# Patient Record
Sex: Female | Born: 1970 | Race: White | Hispanic: No | Marital: Married | Smoking: Current every day smoker
Health system: Southern US, Community
[De-identification: ages and names within clinical notes are randomized; demographics above are authoritative.]

## PROBLEM LIST (undated history)

## (undated) DIAGNOSIS — F32A Depression, unspecified: Secondary | ICD-10-CM

## (undated) DIAGNOSIS — F429 Obsessive-compulsive disorder, unspecified: Secondary | ICD-10-CM

## (undated) DIAGNOSIS — F329 Major depressive disorder, single episode, unspecified: Secondary | ICD-10-CM

## (undated) HISTORY — PX: BACK SURGERY: SHX140

## (undated) HISTORY — DX: Major depressive disorder, single episode, unspecified: F32.9

## (undated) HISTORY — DX: Obsessive-compulsive disorder, unspecified: F42.9

## (undated) HISTORY — DX: Depression, unspecified: F32.A

---

## 2000-08-20 ENCOUNTER — Other Ambulatory Visit: Admission: RE | Admit: 2000-08-20 | Discharge: 2000-08-20 | Payer: Self-pay | Admitting: Obstetrics & Gynecology

## 2001-06-27 ENCOUNTER — Encounter: Admission: RE | Admit: 2001-06-27 | Discharge: 2001-06-27 | Payer: Self-pay | Admitting: General Practice

## 2001-06-27 ENCOUNTER — Encounter: Payer: Self-pay | Admitting: General Practice

## 2001-07-12 ENCOUNTER — Other Ambulatory Visit: Admission: RE | Admit: 2001-07-12 | Discharge: 2001-07-12 | Payer: Self-pay | Admitting: Obstetrics & Gynecology

## 2003-06-06 ENCOUNTER — Other Ambulatory Visit: Admission: RE | Admit: 2003-06-06 | Discharge: 2003-06-06 | Payer: Self-pay | Admitting: Obstetrics and Gynecology

## 2004-07-15 ENCOUNTER — Other Ambulatory Visit: Admission: RE | Admit: 2004-07-15 | Discharge: 2004-07-15 | Payer: Self-pay | Admitting: Obstetrics & Gynecology

## 2005-08-12 ENCOUNTER — Other Ambulatory Visit: Admission: RE | Admit: 2005-08-12 | Discharge: 2005-08-12 | Payer: Self-pay | Admitting: Obstetrics and Gynecology

## 2005-12-15 ENCOUNTER — Ambulatory Visit: Payer: Self-pay | Admitting: Psychiatry

## 2005-12-16 ENCOUNTER — Other Ambulatory Visit (HOSPITAL_COMMUNITY): Admission: RE | Admit: 2005-12-16 | Discharge: 2006-03-16 | Payer: Self-pay | Admitting: Psychiatry

## 2006-01-06 ENCOUNTER — Ambulatory Visit: Payer: Self-pay | Admitting: Psychiatry

## 2006-03-20 ENCOUNTER — Emergency Department (HOSPITAL_COMMUNITY): Admission: EM | Admit: 2006-03-20 | Discharge: 2006-03-20 | Payer: Self-pay | Admitting: Emergency Medicine

## 2007-07-20 ENCOUNTER — Ambulatory Visit (HOSPITAL_COMMUNITY): Admission: RE | Admit: 2007-07-20 | Discharge: 2007-07-20 | Payer: Self-pay | Admitting: Obstetrics & Gynecology

## 2007-07-20 ENCOUNTER — Encounter (INDEPENDENT_AMBULATORY_CARE_PROVIDER_SITE_OTHER): Payer: Self-pay | Admitting: Obstetrics & Gynecology

## 2008-09-05 ENCOUNTER — Encounter: Admission: RE | Admit: 2008-09-05 | Discharge: 2008-09-05 | Payer: Self-pay | Admitting: Obstetrics & Gynecology

## 2009-09-25 ENCOUNTER — Encounter: Admission: RE | Admit: 2009-09-25 | Discharge: 2009-09-25 | Payer: Self-pay | Admitting: Obstetrics & Gynecology

## 2010-05-26 ENCOUNTER — Encounter: Payer: Self-pay | Admitting: Obstetrics & Gynecology

## 2010-09-16 NOTE — Op Note (Signed)
NAMENORISSA, Pamela Singleton               ACCOUNT NO.:  [id number]   MEDICAL RECORD NO.:  [id number]          PATIENT TYPE:  AMB   LOCATION:  SDC                           FACILITY:  [facility]   PHYSICIAN:  Freddy Finner, M.D.   DATE OF BIRTH:  02/19/1971   DATE OF PROCEDURE:  07/20/2007  DATE OF DISCHARGE:                               OPERATIVE REPORT   PREOPERATIVE DIAGNOSIS:  Menorrhagia, request for voluntary  sterilization.   POSTOPERATIVE DIAGNOSIS:  Menorrhagia, request for voluntary  sterilization.   PROCEDURE:  Laparoscopic tubal sterilization with Filshie clip  application to isthmic portion of fallopian tubes, hysteroscopy, D&C,  NovaSure endometrial ablation.   COMPLICATIONS:  Uterine perforation and reinspection of the endometrial  cavity after endometrial ablation.  Other complications; none.   ESTIMATED BLOOD LOSS:  50 mL.   ANESTHESIA:  General endotracheal.   INDICATIONS FOR PROCEDURE:  The patient is a 40 year old with a history  of menorrhagia and a normal sonohysterogram in the office.  Options of  therapy were discussed with her including endometrial ablation which she  found appropriate for her particular circumstances.  She also definitely  wished not to conceive and for that reason requested permanent  sterilization.   DESCRIPTION OF PROCEDURE:  She was admitted on the morning of surgery.  She was given a bolus of Ancef IV preoperatively.  She was brought to  the operating room and placed under adequate general endotracheal  anesthesia, placed in the dorsal lithotomy position using the [place]  stirrups system.  Betadine prep of abdomen, perineum, and vagina was  carried out in the usual fashion.  Bladder was evacuated with sterile  technique using a Foley catheter.  Sterile drapes were applied after  application of the Hulka tenaculum to the cervix.  A small incision was  made at the umbilicus and through it a nonbladed disposable 11 mm trocar  was introduced  while elevating the anterior abdominal wall manually.  Direct inspection revealed adequate placement with no evidence of injury  on entry.  The pneumoperitoneum was allowed to accumulate with carbon  dioxide gas.  Scanning inspection of the upper abdomen revealed no  apparent abnormalities.  The appendix was not visualized.  The pelvic  contents appeared to be normal except for question of septated uterus by  the gross appearance externally.  This resolved when the uterus was  taken off of traction which was applied with a Hulka tenaculum.  The  tubes and ovaries were normal.  There was no peritoneal disease.  A  Filshie clip was placed using the application device which was  backloaded through the operating channel of the laparoscope.  This was  placed on the isthmic portion of each tube and photographs were made  before and after placement of clips.  Gas was then allowed to escape  from the abdomen.  Instruments were removed.  The incision was  anesthetized with 0.5% plain Marcaine.  Incision was closed with  interrupted subcuticular sutures of 3-0 Dexon.  Attention was turned  vaginally.  The uterus sounded to 9 cm.  The cervix sounded to  3.5 cm.  This made the cavity length 5.5 cm.  The cervix was dilated with a Hegar  dilator.  The 12.5 degree hysteroscope was introduced using Ringer's  lactate as a distending medium.  The cavity appeared to be normal.  Gentle thorough curettage was performed and exploration with Randall  stone forceps to collect an endometrial sample.  The NovaSure device was  then loaded without difficulty and the ablation accomplished after  appropriate test and adequate seal.  The cavity width was 3.2 cm.  Power  consumed during the procedure was 97 watts.  The duration of the  ablation was 75 seconds.  The hysteroscope was then reintroduced for  inspection of the cavity, but actually passed through the wall of the  uterus.  This was confirmed by inspection of  intra-abdominal contents,  specifically bowel and fat.  No injury was visualized, but as a  precaution the Foley catheter was placed and the patient was given IV  Indigo Carmine.  It was elected to reinspect laparoscopically and a  defect was identified and photographed in the fundal region  approximately 4-5 cm above the bladder reflection on the anterior fundal  surface.  The defect was approximately 5-6 mm in cross section.  Bleeding was minimal from this site, but bipolar coagulation resolved  the bleeding.  Please note that after perforating the uterus with  careful visual inspection and reinsertion of the cannula, the cavity was  identified and photographed and complete ablation had occurred.  The  laparoscopic procedure did require a second incision placed just above  the symphysis through which a 5 mm trocar was placed.  This allowed  placement of [place] irrigation system and exploration of the defect in  the uterine fundus.  Irrigating solution was Ringer's lactate.  The  irrigating solution and small amount of blood were aspirated from the  abdomen.  Hemostasis was noted to be complete under irrigation and  reduced intra-abdominal pressure.  Blue urine was identified only in the  Foley bag and absence of injury to the bladder was therefore confirmed.  This produced a stable clinical environment with no significant  additional risks.  After removing the irrigating solution from the  abdomen, the umbilical incision was reclosed with 3-0 subcuticular Dexon  sutures and the lower incision was also closed with a subcuticular 3-0  Dexon and 1/4 inch Steri-Strips.  The patient was awakened and taken to  the recovery room in good condition. She had an uneventful PACU stay and  was discharged home with routine outpatient surgical instructions.   This dictation is being dictated on the evening after the procedure and  I have personally contacted the patient at approximately 5:15 p.m.  today  at which time she states that she is doing extremely well without any  significant intra-abdominal or pelvic pain or symptoms.  She sounded  alert and oriented.  The complication was discussed with her as well as  additional symptoms for her to be aware of and to call for immediately  including increased abdominal distention or severe abdominal pain.      Freddy Finner, M.D.  Electronically Signed     WRN/MEDQ  D:  07/20/2007  T:  07/21/2007  Job:  [id number]

## 2010-12-05 IMAGING — MG MM DIGITAL SCREENING
4 series · 4 of 4 positions shown · non-contrast
Comparison: none

DG SCREEN MAMMOGRAM BILATERAL
Bilateral CC and MLO view(s) were taken.

DIGITAL SCREENING MAMMOGRAM WITH CAD:
The breast tissue is heterogeneously dense.  No masses or malignant type calcifications are 
identified.  Compared with prior studies.
Images were processed with CAD.

[R CC]
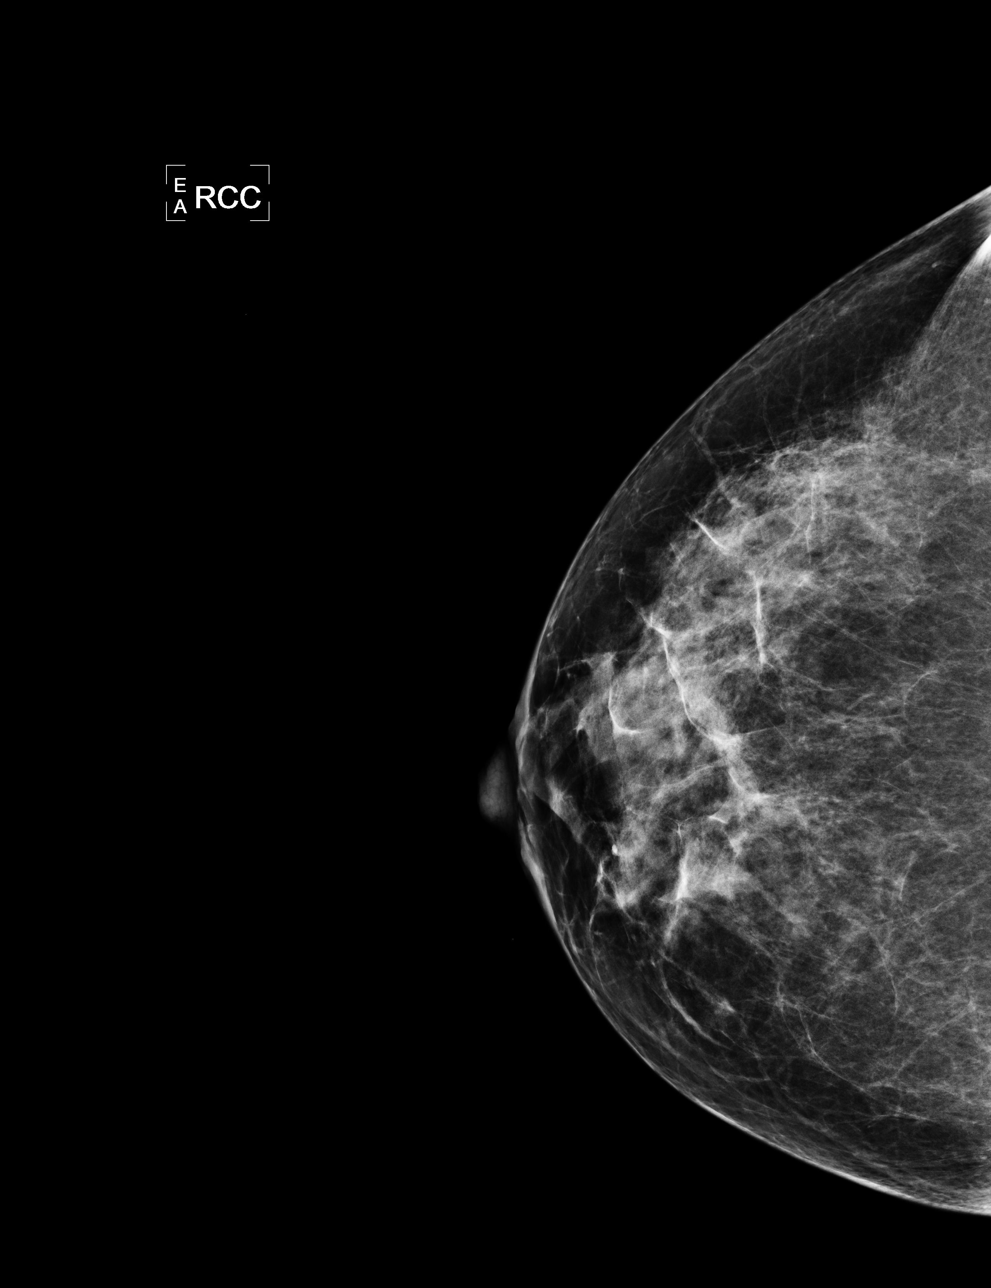

[L CC]
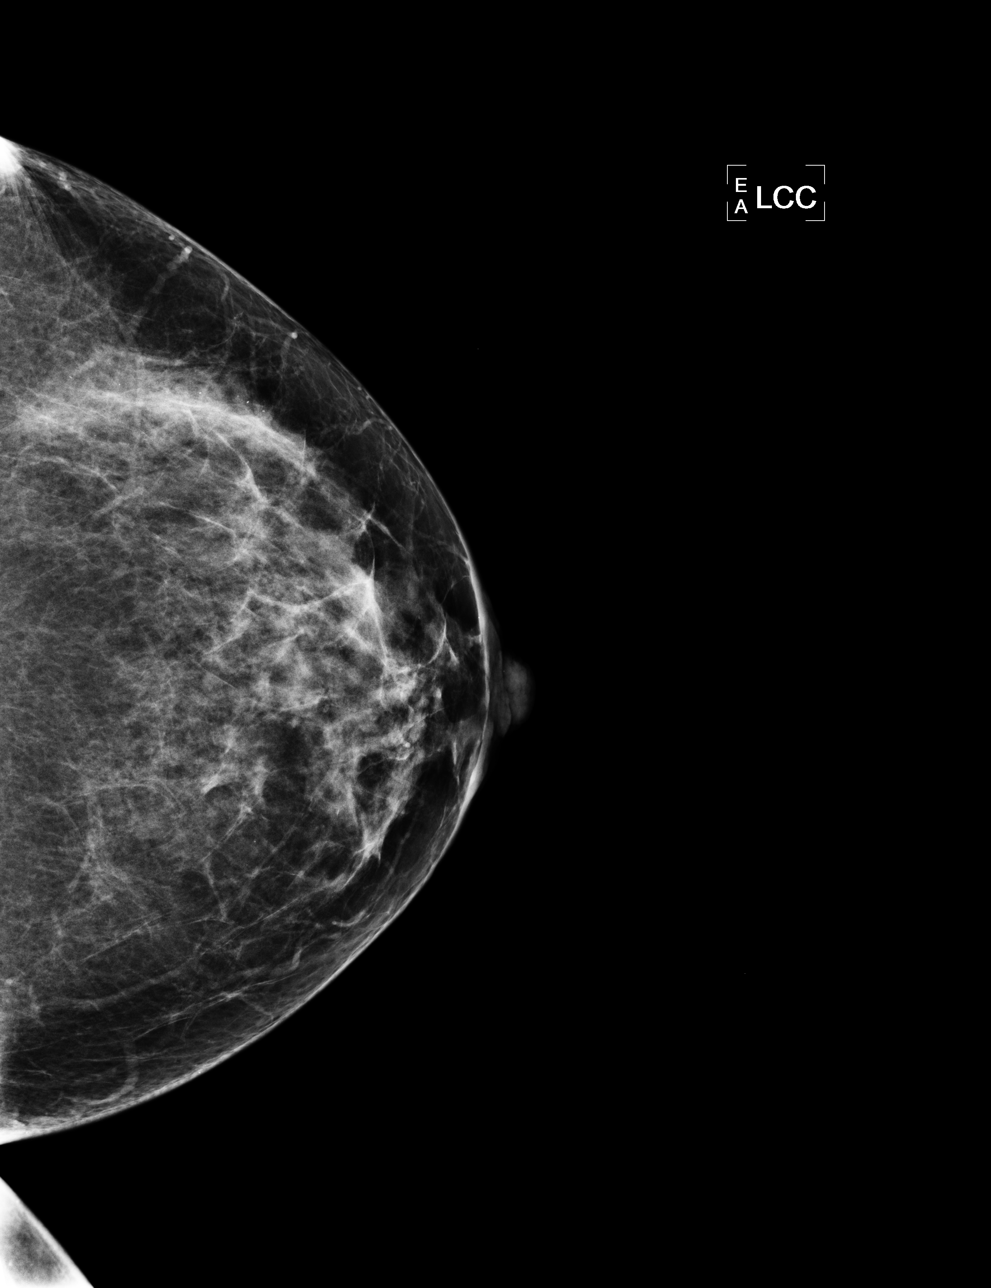

[L MLO]
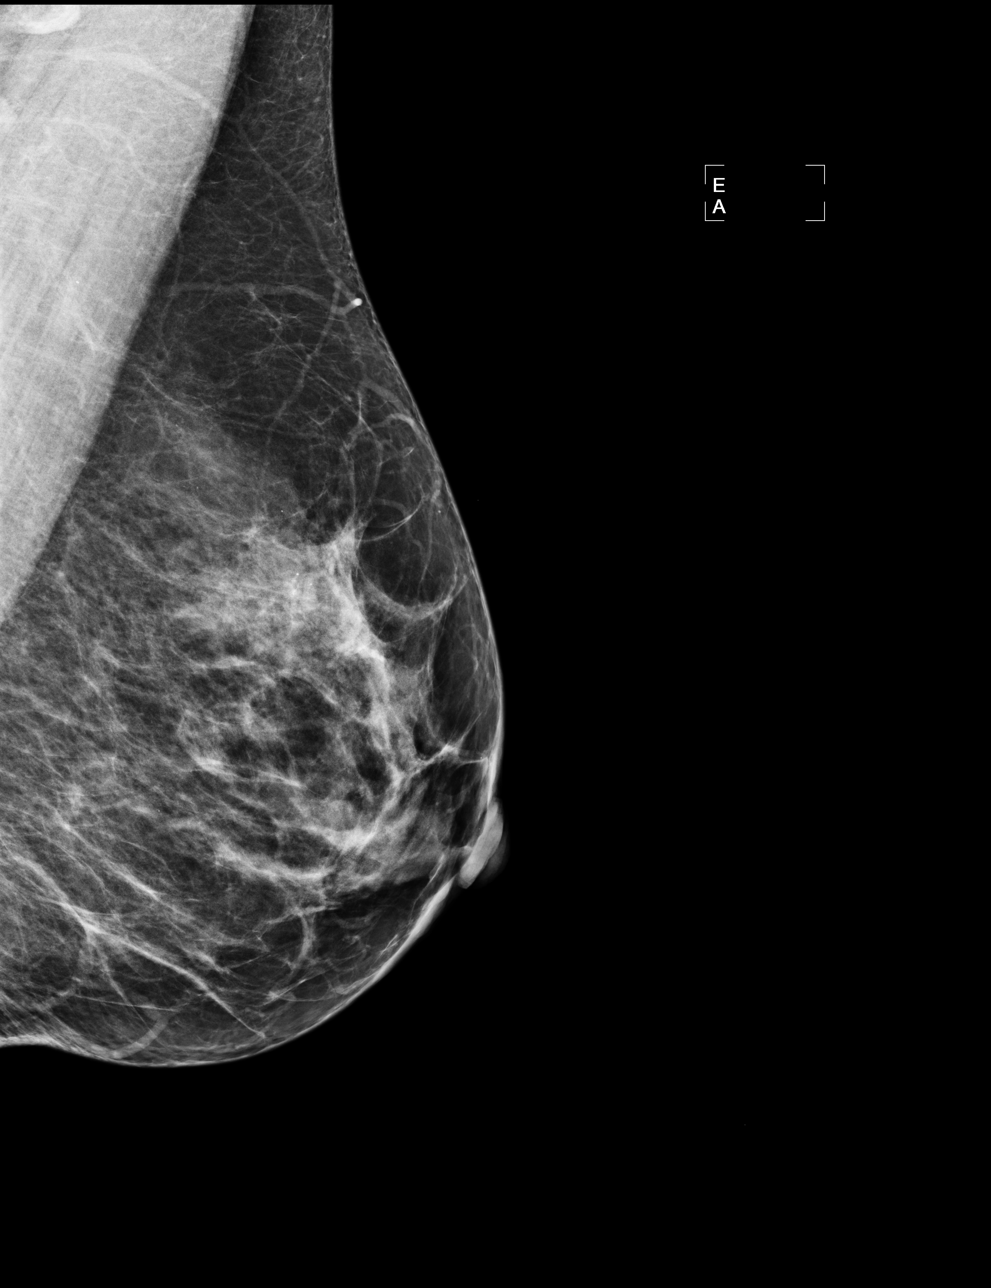

[R MLO]
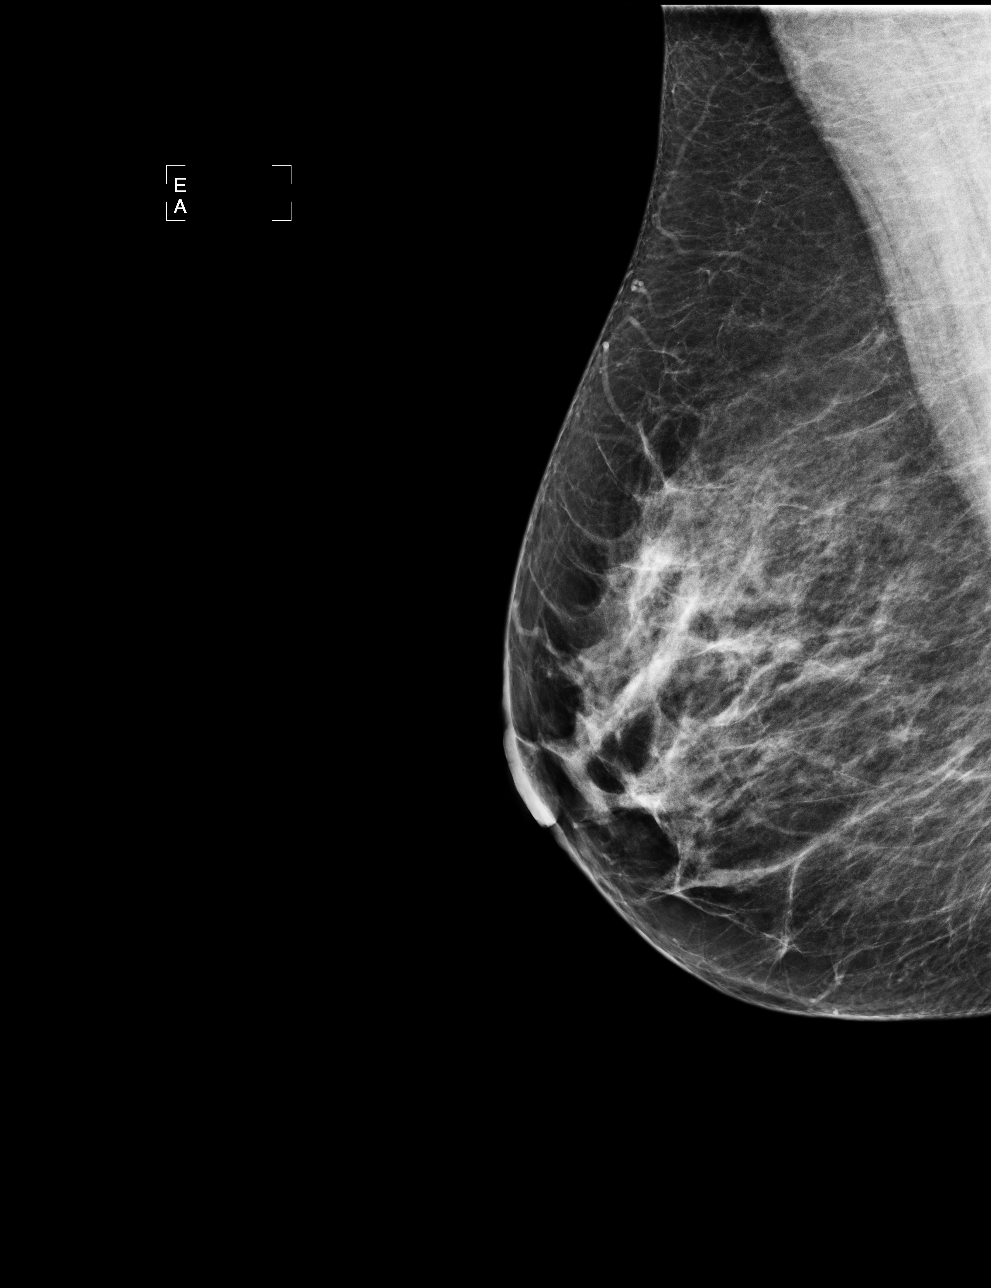

[4 of 4 positions shown; findings below may reference images not displayed]

IMPRESSION: No specific mammographic evidence of malignancy.  Next screening mammogram is recommended in one 
year.

A result letter of this screening mammogram will be mailed directly to the patient.

ASSESSMENT: Negative - BI-RADS 1

Screening mammogram in 1 year.
,

## 2011-01-26 LAB — CBC
Hemoglobin: 13.9
RBC: 4.18
RDW: 11.9
WBC: 8.8

## 2015-05-02 ENCOUNTER — Ambulatory Visit (INDEPENDENT_AMBULATORY_CARE_PROVIDER_SITE_OTHER): Payer: 59 | Admitting: Psychiatry

## 2015-05-02 ENCOUNTER — Encounter (HOSPITAL_COMMUNITY): Payer: Self-pay | Admitting: Psychiatry

## 2015-05-02 VITALS — BP 118/78 | HR 74 | Ht 60.0 in | Wt 140.0 lb

## 2015-05-02 DIAGNOSIS — F411 Generalized anxiety disorder: Secondary | ICD-10-CM | POA: Diagnosis not present

## 2015-05-02 DIAGNOSIS — F429 Obsessive-compulsive disorder, unspecified: Secondary | ICD-10-CM

## 2015-05-02 MED ORDER — ALPRAZOLAM 0.5 MG PO TABS: 0.5000 mg | ORAL_TABLET | Freq: Every evening | ORAL | Status: DC | PRN

## 2015-05-02 MED ORDER — PROPRANOLOL HCL 60 MG PO TABS: 60.0000 mg | ORAL_TABLET | Freq: Every day | ORAL | Status: DC

## 2015-05-02 MED ORDER — FLUVOXAMINE MALEATE 100 MG PO TABS: 100.0000 mg | ORAL_TABLET | Freq: Two times a day (BID) | ORAL | Status: DC
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
## 2015-07-24 ENCOUNTER — Other Ambulatory Visit (HOSPITAL_COMMUNITY): Payer: Self-pay | Admitting: Psychiatry

## 2015-07-31 ENCOUNTER — Encounter (HOSPITAL_COMMUNITY): Payer: Self-pay | Admitting: Psychiatry

## 2015-07-31 ENCOUNTER — Ambulatory Visit (INDEPENDENT_AMBULATORY_CARE_PROVIDER_SITE_OTHER): Payer: 59 | Admitting: Psychiatry

## 2015-07-31 VITALS — BP 125/84 | HR 80 | Ht 60.0 in | Wt 138.2 lb

## 2015-07-31 DIAGNOSIS — F411 Generalized anxiety disorder: Secondary | ICD-10-CM | POA: Diagnosis not present

## 2015-07-31 DIAGNOSIS — F429 Obsessive-compulsive disorder, unspecified: Secondary | ICD-10-CM | POA: Diagnosis not present

## 2015-07-31 MED ORDER — PROPRANOLOL HCL 60 MG PO TABS: 60.0000 mg | ORAL_TABLET | Freq: Every day | ORAL | Status: DC

## 2015-07-31 MED ORDER — ALPRAZOLAM 0.5 MG PO TABS: 0.5000 mg | ORAL_TABLET | Freq: Every evening | ORAL | Status: DC | PRN

## 2015-07-31 MED ORDER — FLUVOXAMINE MALEATE 100 MG PO TABS: 100.0000 mg | ORAL_TABLET | Freq: Two times a day (BID) | ORAL | Status: DC
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
## 2015-09-26 ENCOUNTER — Other Ambulatory Visit (HOSPITAL_COMMUNITY): Payer: Self-pay | Admitting: Psychiatry

## 2015-10-01 ENCOUNTER — Telehealth (HOSPITAL_COMMUNITY): Payer: Self-pay

## 2015-10-01 MED ORDER — FLUVOXAMINE MALEATE 100 MG PO TABS: 100.0000 mg | ORAL_TABLET | Freq: Two times a day (BID) | ORAL | Status: DC

## 2015-10-01 MED ORDER — ALPRAZOLAM 0.5 MG PO TABS: 0.5000 mg | ORAL_TABLET | Freq: Every evening | ORAL | Status: DC | PRN

## 2015-10-01 MED ORDER — PROPRANOLOL HCL 60 MG PO TABS: 60.0000 mg | ORAL_TABLET | Freq: Every day | ORAL | Status: DC

## 2015-10-01 NOTE — Telephone Encounter (Signed)
Called in all three prescriptions

## 2015-10-01 NOTE — Telephone Encounter (Signed)
We can give 30 day supplies of the Propranolol and Luvox. Give 15 tabs of Xanax

## 2015-10-01 NOTE — Telephone Encounter (Signed)
Patient last saw Dr. Rutherford Limerickadepalli on 3/29 and is scheduled to come and see you on 6/29 - patient will be out of her medications at the end of this month, okay to refill? She is currently taking Propranolol 60 mg, Luvox 100 mg and Xanax 0.5 mg. Please review and advise, thank you

## 2015-10-31 ENCOUNTER — Encounter (HOSPITAL_COMMUNITY): Payer: Self-pay | Admitting: Psychiatry

## 2015-10-31 ENCOUNTER — Ambulatory Visit (INDEPENDENT_AMBULATORY_CARE_PROVIDER_SITE_OTHER): Payer: 59 | Admitting: Psychiatry

## 2015-10-31 VITALS — BP 142/82 | HR 83 | Ht 60.0 in | Wt 144.4 lb

## 2015-10-31 DIAGNOSIS — F330 Major depressive disorder, recurrent, mild: Secondary | ICD-10-CM

## 2015-10-31 DIAGNOSIS — F411 Generalized anxiety disorder: Secondary | ICD-10-CM | POA: Diagnosis not present

## 2015-10-31 DIAGNOSIS — F429 Obsessive-compulsive disorder, unspecified: Secondary | ICD-10-CM

## 2015-10-31 MED ORDER — PROPRANOLOL HCL 60 MG PO TABS
60.0000 mg | ORAL_TABLET | Freq: Every day | ORAL | Status: DC
Start: 2015-10-31 — End: 2016-03-09

## 2015-10-31 MED ORDER — FLUVOXAMINE MALEATE 100 MG PO TABS: 100.0000 mg | ORAL_TABLET | Freq: Two times a day (BID) | ORAL | Status: DC
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
## 2016-02-08 ENCOUNTER — Other Ambulatory Visit (HOSPITAL_COMMUNITY): Payer: Self-pay | Admitting: Psychiatry

## 2016-02-08 DIAGNOSIS — F330 Major depressive disorder, recurrent, mild: Secondary | ICD-10-CM

## 2016-02-08 DIAGNOSIS — F411 Generalized anxiety disorder: Secondary | ICD-10-CM

## 2016-02-08 DIAGNOSIS — F429 Obsessive-compulsive disorder, unspecified: Secondary | ICD-10-CM

## 2016-03-08 ENCOUNTER — Other Ambulatory Visit (HOSPITAL_COMMUNITY): Payer: Self-pay | Admitting: Psychiatry

## 2016-03-08 DIAGNOSIS — F429 Obsessive-compulsive disorder, unspecified: Secondary | ICD-10-CM

## 2016-03-08 DIAGNOSIS — F411 Generalized anxiety disorder: Secondary | ICD-10-CM

## 2016-03-09 ENCOUNTER — Other Ambulatory Visit (HOSPITAL_COMMUNITY): Payer: Self-pay

## 2016-03-09 DIAGNOSIS — F411 Generalized anxiety disorder: Secondary | ICD-10-CM

## 2016-03-09 DIAGNOSIS — F429 Obsessive-compulsive disorder, unspecified: Secondary | ICD-10-CM

## 2016-03-09 MED ORDER — PROPRANOLOL HCL 60 MG PO TABS: 60.0000 mg | ORAL_TABLET | Freq: Every day | ORAL | 0 refills | Status: DC

## 2016-04-20 ENCOUNTER — Other Ambulatory Visit (HOSPITAL_COMMUNITY): Payer: Self-pay

## 2016-04-20 DIAGNOSIS — F330 Major depressive disorder, recurrent, mild: Secondary | ICD-10-CM

## 2016-04-20 DIAGNOSIS — F411 Generalized anxiety disorder: Secondary | ICD-10-CM

## 2016-04-20 MED ORDER — FLUVOXAMINE MALEATE 100 MG PO TABS: 100.0000 mg | ORAL_TABLET | Freq: Two times a day (BID) | ORAL | 0 refills | Status: DC

## 2016-05-28 ENCOUNTER — Encounter (HOSPITAL_COMMUNITY): Payer: Self-pay | Admitting: Psychiatry

## 2016-05-28 ENCOUNTER — Ambulatory Visit (INDEPENDENT_AMBULATORY_CARE_PROVIDER_SITE_OTHER): Payer: 59 | Admitting: Psychiatry

## 2016-05-28 DIAGNOSIS — F330 Major depressive disorder, recurrent, mild: Secondary | ICD-10-CM

## 2016-05-28 DIAGNOSIS — F429 Obsessive-compulsive disorder, unspecified: Secondary | ICD-10-CM | POA: Diagnosis not present

## 2016-05-28 DIAGNOSIS — Z9889 Other specified postprocedural states: Secondary | ICD-10-CM | POA: Diagnosis not present

## 2016-05-28 DIAGNOSIS — F411 Generalized anxiety disorder: Secondary | ICD-10-CM

## 2016-05-28 DIAGNOSIS — Z818 Family history of other mental and behavioral disorders: Secondary | ICD-10-CM

## 2016-05-28 MED ORDER — FLUVOXAMINE MALEATE 100 MG PO TABS: 100.0000 mg | ORAL_TABLET | Freq: Two times a day (BID) | ORAL | 0 refills | Status: AC

## 2016-05-28 MED ORDER — ALPRAZOLAM 0.5 MG PO TABS: 0.5000 mg | ORAL_TABLET | Freq: Every evening | ORAL | 1 refills | Status: AC | PRN

## 2016-05-28 MED ORDER — PROPRANOLOL HCL 60 MG PO TABS: 60.0000 mg | ORAL_TABLET | Freq: Every day | ORAL | 0 refills | Status: AC
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
## 2016-06-01 ENCOUNTER — Other Ambulatory Visit (HOSPITAL_COMMUNITY): Payer: Self-pay | Admitting: Psychiatry

## 2016-06-01 DIAGNOSIS — F429 Obsessive-compulsive disorder, unspecified: Secondary | ICD-10-CM

## 2016-06-01 DIAGNOSIS — F411 Generalized anxiety disorder: Secondary | ICD-10-CM

## 2016-08-26 ENCOUNTER — Telehealth (HOSPITAL_COMMUNITY): Payer: Self-pay

## 2016-08-26 NOTE — Telephone Encounter (Signed)
Patient called she said she was unable to make tomorrows appointment and wanted to know if Dr. Michae Kava would refill medications if she rescheduled. However she opted to make it work because she would not be able to get back in until June

## 2016-08-27 ENCOUNTER — Ambulatory Visit (HOSPITAL_COMMUNITY): Payer: Self-pay | Admitting: Psychiatry
